# Patient Record
Sex: Male | Born: 1985 | Race: Black or African American | Hispanic: No | Marital: Single | Smoking: Current some day smoker
Health system: Southern US, Community
[De-identification: ages and names within clinical notes are randomized; demographics above are authoritative.]

---

## 2014-11-23 ENCOUNTER — Emergency Department
Admission: EM | Admit: 2014-11-23 | Discharge: 2014-11-23 | Disposition: A | Discharge status: Home / Self Care | Payer: Self-pay | Attending: Emergency Medicine | Admitting: Emergency Medicine

## 2014-11-23 ENCOUNTER — Encounter: Payer: Self-pay | Admitting: *Deleted

## 2014-11-23 DIAGNOSIS — Z720 Tobacco use: Secondary | ICD-10-CM | POA: Insufficient documentation

## 2014-11-23 DIAGNOSIS — L0291 Cutaneous abscess, unspecified: Secondary | ICD-10-CM

## 2014-11-23 DIAGNOSIS — R51 Headache: Secondary | ICD-10-CM | POA: Insufficient documentation

## 2014-11-23 DIAGNOSIS — L0231 Cutaneous abscess of buttock: Secondary | ICD-10-CM | POA: Insufficient documentation

## 2014-11-23 LAB — CBC
HCT: 44.2 % (ref 40.0–52.0)
Hemoglobin: 15 g/dL (ref 13.0–18.0)
MCH: 29.2 pg (ref 26.0–34.0)
MCHC: 33.8 g/dL (ref 32.0–36.0)
MCV: 86.4 fL (ref 80.0–100.0)
Platelets: 160 10*3/uL (ref 150–440)
RBC: 5.12 MIL/uL (ref 4.40–5.90)
RDW: 12.9 % (ref 11.5–14.5)
WBC: 10.8 10*3/uL — ABNORMAL HIGH (ref 3.8–10.6)

## 2014-11-23 LAB — URINALYSIS COMPLETE WITH MICROSCOPIC (ARMC ONLY)
BACTERIA UA: NONE SEEN
Bilirubin Urine: NEGATIVE
GLUCOSE, UA: NEGATIVE mg/dL
Hgb urine dipstick: NEGATIVE
Ketones, ur: NEGATIVE mg/dL
Leukocytes, UA: NEGATIVE
NITRITE: NEGATIVE
PH: 6 (ref 5.0–8.0)
PROTEIN: 30 mg/dL — AB
Specific Gravity, Urine: 1.029 (ref 1.005–1.030)
Squamous Epithelial / LPF: NONE SEEN

## 2014-11-23 LAB — BASIC METABOLIC PANEL
ANION GAP: 5 (ref 5–15)
BUN: 9 mg/dL (ref 6–20)
CALCIUM: 8.5 mg/dL — AB (ref 8.9–10.3)
CO2: 28 mmol/L (ref 22–32)
Chloride: 103 mmol/L (ref 101–111)
Creatinine, Ser: 1.18 mg/dL (ref 0.61–1.24)
GFR calc non Af Amer: >60 mL/min (ref >60)
Glucose, Bld: 113 mg/dL — ABNORMAL HIGH (ref 65–99)
Potassium: 3.4 mmol/L — ABNORMAL LOW (ref 3.5–5.1)
SODIUM: 136 mmol/L (ref 135–145)

## 2014-11-23 MED ORDER — MORPHINE SULFATE 4 MG/ML IJ SOLN
4.0000 mg | Freq: Once | INTRAMUSCULAR | Status: AC
  Administered 2014-11-23: 4 mg via INTRAMUSCULAR

## 2014-11-23 MED ORDER — MORPHINE SULFATE 4 MG/ML IJ SOLN
INTRAMUSCULAR | Status: AC
  Administered 2014-11-23: 4 mg via INTRAMUSCULAR
  Filled 2014-11-23: qty 1

## 2014-11-23 MED ORDER — MORPHINE SULFATE 4 MG/ML IJ SOLN
4.0000 mg | Freq: Once | INTRAMUSCULAR | Status: AC
  Administered 2014-11-23: 4 mg via INTRAVENOUS
  Filled 2014-11-23: qty 1

## 2014-11-23 MED ORDER — OXYCODONE-ACETAMINOPHEN 5-325 MG PO TABS: 1.0000 | ORAL_TABLET | Freq: Four times a day (QID) | ORAL | Status: AC | PRN

## 2014-11-23 MED ORDER — CLINDAMYCIN HCL 300 MG PO CAPS: 300.0000 mg | ORAL_CAPSULE | Freq: Three times a day (TID) | ORAL | Status: AC

## 2014-11-23 MED ORDER — LIDOCAINE HCL (PF) 1 % IJ SOLN
INTRAMUSCULAR | Status: AC
  Administered 2014-11-23: 5 mL
  Filled 2014-11-23: qty 5

## 2014-11-23 MED ORDER — CLINDAMYCIN PHOSPHATE 600 MG/50ML IV SOLN
600.0000 mg | Freq: Once | INTRAVENOUS | Status: AC
  Administered 2014-11-23: 600 mg via INTRAVENOUS
  Filled 2014-11-23: qty 50

## 2014-11-23 NOTE — ED Notes (Addendum)
Pt reports abscess on right buttock since Sunday.  Pt states he tried to pop it and it increased pain.  Pt reports increased pain with coughing, sneezing, movement.  Pt reports fever/chills.  Pt reports swelling spreads to scrotum.  Pt anxious during assessment.

## 2014-11-23 NOTE — ED Notes (Signed)
Pt has an abscess to right buttock.  Sx for 2 days.  Pt reports trying to pop it today and pain got worse.  Pt unable to sit.

## 2014-11-23 NOTE — Discharge Instructions (Signed)

## 2014-11-23 NOTE — ED Provider Notes (Signed)
[facility] Emergency Department Provider Note  ____________________________________________  Time seen: Approximately 6:08 AM  I have reviewed the triage vital signs and the nursing notes.   HISTORY  Chief Complaint Abscess    HPI Martin Alvarez is a 29 y.o. male comes in with an abscess and pain through his bottom. The patient reports that he has pain when he urinates due to his pain. He noticed a bump on Sunday and reports that he squeezed some blood and pus out of it on Monday. He reports that he tried to do it again but it simply made the symptoms of the pain worse. The patient reports that he has 10 out of 10 pain is had some mild headache and felt really feverish. The patient reports that he can't walk as he is in so much pain. He reports that anything he does seems to make the pain worse. The patient decided to come in for further evaluation because of how uncomfortable he is at this time.  History reviewed. No pertinent past medical history.  There are no active problems to display for this patient.   History reviewed. No pertinent past surgical history.  Current Outpatient Rx  Name  Route  Sig  Dispense  Refill  . clindamycin (CLEOCIN) 300 MG capsule   Oral   Take 1 capsule (300 mg total) by mouth 3 (three) times daily.   30 capsule   0   . oxyCODONE-acetaminophen (ROXICET) 5-325 MG per tablet   Oral   Take 1 tablet by mouth every 6 (six) hours as needed.   12 tablet   0     Allergies Review of patient's allergies indicates no known allergies.  History reviewed. No pertinent family history.  Social History History  Substance Use Topics  . Smoking status: Current Some Day Smoker  . Smokeless tobacco: Never Used  . Alcohol Use: Yes    Review of Systems Constitutional: Chills, feverish Eyes: No visual changes. ENT: No sore throat. Cardiovascular: Denies chest pain. Respiratory: Denies shortness of breath. Gastrointestinal: No  abdominal pain.  No nausea, no vomiting.  No diarrhea.  No constipation. Genitourinary: Negative for dysuria. Musculoskeletal: Negative for back pain. Skin: right gluteal abscess Neurological: Headache  10-point ROS otherwise negative.  ____________________________________________   PHYSICAL EXAM:  VITAL SIGNS: ED Triage Vitals  Enc Vitals Group     BP 11/23/14 0214 125/72 mmHg     Pulse Rate 11/23/14 0214 50     Resp 11/23/14 0214 18     Temp 11/23/14 0214 99.7 F (37.6 C)     Temp Source 11/23/14 0214 Oral     SpO2 11/23/14 0214 99 %     Weight 11/23/14 0214 153 lb (69.4 kg)     Height 11/23/14 0214 5\' 11"  (1.803 m)     Head Cir --      Peak Flow --      Pain Score 11/23/14 0216 10     Pain Loc --      Pain Edu? --      Excl. in GC? --     Constitutional: Alert and oriented. Well appearing and in no acute distress. Eyes: Conjunctivae are normal. PERRL. EOMI. Head: Atraumatic. Nose: No congestion/rhinnorhea. Mouth/Throat: Mucous membranes are moist.  Oropharynx non-erythematous. Cardiovascular: Normal rate, regular rhythm. Grossly normal heart sounds.  Good peripheral circulation. Respiratory: Normal respiratory effort.  No retractions. Lungs CTAB. Gastrointestinal: Soft and nontender. No distention. positive bowel sounds Genitourinary: deferred Musculoskeletal: No lower extremity  tenderness nor edema.   Neurologic:  Normal speech and language. No gross focal neurologic deficits are appreciated.  Skin:  Skin is warm, dry, fluctuant abscess in right gluteal verge, significant swelling and induration into perineum Psychiatric: Mood and affect are normal.  ____________________________________________   LABS (all labs ordered are listed, but only abnormal results are displayed)  Labs Reviewed  CBC - Abnormal; Notable for the following:    WBC 10.8 (*)    All other components within normal limits  BASIC METABOLIC PANEL - Abnormal; Notable for the following:     Potassium 3.4 (*)    Glucose, Bld 113 (*)    Calcium 8.5 (*)    All other components within normal limits  CULTURE, BLOOD (ROUTINE X 2)  CULTURE, BLOOD (ROUTINE X 2)  URINALYSIS COMPLETEWITH MICROSCOPIC ([facility] ONLY)   ____________________________________________  EKG  none ____________________________________________  RADIOLOGY  none ____________________________________________   PROCEDURES  Procedure(s) performed: please, see procedure note(s).   INCISION AND DRAINAGE Performed by: Lucrezia Europe P Consent: Verbal consent obtained. Risks and benefits: risks, benefits and alternatives were discussed Type: abscess  Body area: right gluteus  Anesthesia: local infiltration  Incision was made with a scalpel.  Local anesthetic: lidocaine 1% without epinephrine  Anesthetic total: 1.5 ml  Complexity: complex Blunt dissection to break up loculations  Drainage: purulent  Drainage amount: 5-75mls  Packing material: 1/4 in iodoform gauze  Patient tolerance: Patient tolerated the procedure well with no immediate complications.     Critical Care performed: No  ____________________________________________   INITIAL IMPRESSION / ASSESSMENT AND PLAN / ED COURSE  Pertinent labs & imaging results that were available during my care of the patient were reviewed by me and considered in my medical decision making (see chart for details).  The patient is a 29 year old male who comes in today with an abscess to his right gluteus. I did incise the patient's wound. I did give him a dose of clindamycin IV. The patient does have some significant swelling in his perineum . I will have the patient return to follow-up to determine if his swelling is improving. The patient is also awaiting a urinalysis. His urinalysis will be signed out to Dr. Fanny Bien who will follow-up the results of the urine and disposition the patient. ____________________________________________   FINAL  CLINICAL IMPRESSION(S) / ED DIAGNOSES  Final diagnoses:  Abscess  Abscess, gluteal, right      Rebecka Apley, MD 11/23/14 [phone]

## 2014-11-28 LAB — CULTURE, BLOOD (ROUTINE X 2)
CULTURE: NO GROWTH
Culture: NO GROWTH
Special Requests: NORMAL
Special Requests: NORMAL

## 2021-01-19 ENCOUNTER — Other Ambulatory Visit: Payer: Self-pay

## 2021-01-19 ENCOUNTER — Emergency Department: Payer: Worker's Compensation

## 2021-01-19 ENCOUNTER — Emergency Department
Admission: EM | Admit: 2021-01-19 | Discharge: 2021-01-19 | Disposition: A | Discharge status: Home / Self Care | Payer: Worker's Compensation | Attending: Emergency Medicine | Admitting: Emergency Medicine

## 2021-01-19 DIAGNOSIS — Y92219 Unspecified school as the place of occurrence of the external cause: Secondary | ICD-10-CM | POA: Diagnosis not present

## 2021-01-19 DIAGNOSIS — W860XXA Exposure to domestic wiring and appliances, initial encounter: Secondary | ICD-10-CM | POA: Insufficient documentation

## 2021-01-19 DIAGNOSIS — S59912A Unspecified injury of left forearm, initial encounter: Secondary | ICD-10-CM | POA: Diagnosis present

## 2021-01-19 DIAGNOSIS — Y990 Civilian activity done for income or pay: Secondary | ICD-10-CM | POA: Insufficient documentation

## 2021-01-19 DIAGNOSIS — M25532 Pain in left wrist: Secondary | ICD-10-CM | POA: Insufficient documentation

## 2021-01-19 DIAGNOSIS — F17200 Nicotine dependence, unspecified, uncomplicated: Secondary | ICD-10-CM | POA: Insufficient documentation

## 2021-01-19 DIAGNOSIS — S51832A Puncture wound without foreign body of left forearm, initial encounter: Secondary | ICD-10-CM

## 2021-01-19 MED ORDER — MELOXICAM 15 MG PO TABS: 15.0000 mg | ORAL_TABLET | Freq: Every day | ORAL | 0 refills | Status: AC

## 2021-01-19 MED ORDER — MELOXICAM 7.5 MG PO TABS: 15.0000 mg | ORAL_TABLET | Freq: Once | ORAL | Status: DC

## 2021-01-19 MED ORDER — HYDROCODONE-ACETAMINOPHEN 5-325 MG PO TABS
1.0000 | ORAL_TABLET | Freq: Once | ORAL | Status: AC
  Administered 2021-01-19: 1 via ORAL
  Filled 2021-01-19: qty 1

## 2021-01-19 MED ORDER — HYDROCODONE-ACETAMINOPHEN 5-325 MG PO TABS: 1.0000 | ORAL_TABLET | ORAL | 0 refills | Status: AC | PRN

## 2021-01-19 NOTE — ED Provider Notes (Signed)
[facility] Emergency Department Provider Note  ____________________________________________  Time seen: Approximately 8:28 PM  I have reviewed the triage vital signs and the nursing notes.   HISTORY  Chief Complaint Laceration    HPI Martin Alvarez is a 35 y.o. male who presents emergency department complaining of puncture wound to the left forearm.  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  Patient states that the wire punctured into his forearm.  He had pain running down into his fingers as well as a numb sensation.  He has full range of motion currently.  Sensation is improving at this time.  Patient's last tetanus shot was less than 2 years ago.  In addition to this injury patient has a superficial cut along the hand distal to the site.  No active bleeding.       History reviewed. No pertinent past medical history.  There are no problems to display for this patient.   History reviewed. No pertinent surgical history.  Prior to Admission medications   Medication Sig Start Date End Date Taking? Authorizing Provider  HYDROcodone-acetaminophen (NORCO/VICODIN) 5-325 MG tablet Take 1 tablet by mouth every 4 (four) hours as needed for moderate pain. 01/19/21 01/19/22 Yes Myanna Ziesmer, Delorise Royals, PA-C  meloxicam (MOBIC) 15 MG tablet Take 1 tablet (15 mg total) by mouth daily. 01/19/21  Yes Justine Cossin, Delorise Royals, PA-C  clindamycin (CLEOCIN) 300 MG capsule Take 1 capsule (300 mg total) by mouth 3 (three) times daily. 11/23/14   Rebecka Apley, MD  oxyCODONE-acetaminophen (ROXICET) 5-325 MG per tablet Take 1 tablet by mouth every 6 (six) hours as needed. 11/23/14   Rebecka Apley, MD    Allergies Patient has no known allergies.  No family history on file.  Social History Social History   Tobacco Use   Smoking status: Some Days   Smokeless tobacco: Never  Substance Use Topics   Alcohol use: Yes   Drug use: No      Review of Systems  Constitutional: No fever/chills Eyes: No visual changes. No discharge ENT: No upper respiratory complaints. Cardiovascular: no chest pain. Respiratory: no cough. No SOB. Gastrointestinal: No abdominal pain.  No nausea, no vomiting.  No diarrhea.  No constipation. Musculoskeletal: Positive for puncture wound to the left forearm. Skin: Negative for rash, abrasions, lacerations, ecchymosis. Neurological: Negative for headaches, focal weakness or numbness.  10 System ROS otherwise negative.  ____________________________________________   PHYSICAL EXAM:  VITAL SIGNS: ED Triage Vitals  Enc Vitals Group     BP 01/19/21 1755 (!) 144/108     Pulse Rate 01/19/21 1755 78     Resp 01/19/21 1755 18     Temp 01/19/21 1755 98 F (36.7 C)     Temp src --      SpO2 01/19/21 1755 100 %     Weight --      Height --      Head Circumference --      Peak Flow --      Pain Score 01/19/21 1754 10     Pain Loc --      Pain Edu? --      Excl. in GC? --      Constitutional: Alert and oriented. Well appearing and in no acute distress. Eyes: Conjunctivae are normal. PERRL. EOMI. Head: Atraumatic. ENT:      Ears:       Nose: No congestion/rhinnorhea.      Mouth/Throat: Mucous membranes are  moist.  Neck: No stridor.    Cardiovascular: Normal rate, regular rhythm. Normal S1 and S2.  Good peripheral circulation. Respiratory: Normal respiratory effort without tachypnea or retractions. Lungs CTAB. Good air entry to the bases with no decreased or absent breath sounds. Musculoskeletal: Full range of motion to all extremities. No gross deformities appreciated.  Visualization of the left forearm reveals a puncture wound along the medial aspect of the forearm and a superficial laceration over the hypothenar eminence.  This appears to be more of a scratch than a true laceration.  Puncture wound has some mild edema and mild ecchymosis in this area.  No active bleeding.  No visible  foreign body.  Area is tender to palpation.  Full sensation in all digits that there is slightly decreased sensation of the fourth and fifth digit.  Full range of motion of all digits and good grip strength.  Capillary refill less than 2 seconds all digits. Neurologic:  Normal speech and language. No gross focal neurologic deficits are appreciated.  Skin:  Skin is warm, dry and intact. No rash noted. Psychiatric: Mood and affect are normal. Speech and behavior are normal. Patient exhibits appropriate insight and judgement.   ____________________________________________   LABS (all labs ordered are listed, but only abnormal results are displayed)  Labs Reviewed - No data to display ____________________________________________  EKG   ____________________________________________  RADIOLOGY I personally viewed and evaluated these images as part of my medical decision making, as well as reviewing the written report by the radiologist.  ED Provider Interpretation: No evidence of retained foreign body or acute underlying osseous abnormality.  Soft tissue swelling identified on x-ray is underlying area of superficial laceration.  No evidence of infection.  DG Wrist Complete Left  Result Date: 01/19/2021 CLINICAL DATA:  Puncture wound to LEFT wrist from metal wire, LEFT wrist laceration. EXAM: LEFT WRIST - COMPLETE 3+ VIEW COMPARISON:  None FINDINGS: No sign of fracture or dislocation. No acute bony abnormality. Soft tissue swelling about the thenar eminence and webbing between first and second digit. Considerable swelling in this location. No radiopaque foreign body. IMPRESSION: Soft tissue swelling about the thenar eminence and webbing between first and second digit. No radiopaque foreign body or underlying osseous abnormality. Correlate with any signs of hematoma or infection in this location. Electronically Signed   By: Donzetta Kohut M.D.   On: 01/19/2021 20:07     ____________________________________________    PROCEDURES  Procedure(s) performed:    Procedures    Medications  meloxicam (MOBIC) tablet 15 mg (has no administration in time range)  HYDROcodone-acetaminophen (NORCO/VICODIN) 5-325 MG per tablet 1 tablet (has no administration in time range)     ____________________________________________   INITIAL IMPRESSION / ASSESSMENT AND PLAN / ED COURSE  Pertinent labs & imaging results that were available during my care of the patient were reviewed by me and considered in my medical decision making (see chart for details).  Review of the [place] CSRS was performed in accordance of the [facility] prior to dispensing any controlled drugs.           Patient's diagnosis is consistent with puncture wound to the forearm.  Patient presented to the emergency department after sustaining a puncture wound at work.  There was no retained foreign body upon inspection or imaging.  Patient is having continued improvement of the numbness in his fingers.  I suspect that this is from soft tissue swelling overlying the ulnar nerve.  There is no evidence of nerve injury.  Patient will be placed on anti-inflammatory limited pain medication.  He was up-to-date on tetanus immunization.  Return precautions discussed with the patient.  Otherwise follow-up with orthopedics..  Patient is given ED precautions to return to the ED for any worsening or new symptoms.     ____________________________________________  FINAL CLINICAL IMPRESSION(S) / ED DIAGNOSES  Final diagnoses:  Puncture wound of left forearm, initial encounter      NEW MEDICATIONS STARTED DURING THIS VISIT:  ED Discharge Orders          Ordered    meloxicam (MOBIC) 15 MG tablet  Daily        01/19/21 2027    HYDROcodone-acetaminophen (NORCO/VICODIN) 5-325 MG tablet  Every 4 hours PRN        01/19/21 2027                This chart was dictated using voice recognition  software/Dragon. Despite best efforts to proofread, errors can occur which can change the meaning. Any change was purely unintentional.    Lanette Hampshire 01/19/21 2033    Sharman Cheek, MD 01/19/21 2337

## 2021-01-19 NOTE — ED Triage Notes (Signed)
Pt comes with c/o left wrist lac and right thumb cut while at work. Bandage in place bleeding controlled.

## 2021-01-19 NOTE — ED Triage Notes (Signed)
[facility] profile states UDS only upon request, no request made.

## 2023-04-03 IMAGING — DX DG WRIST COMPLETE 3+V*L*
4 series · 4 of 4 positions shown · non-contrast
Comparison: None

CLINICAL DATA: Puncture wound to LEFT wrist from metal wire, LEFT
wrist laceration.

EXAM:
LEFT WRIST - COMPLETE 3+ VIEW

[wrist ap (1 of 2)]
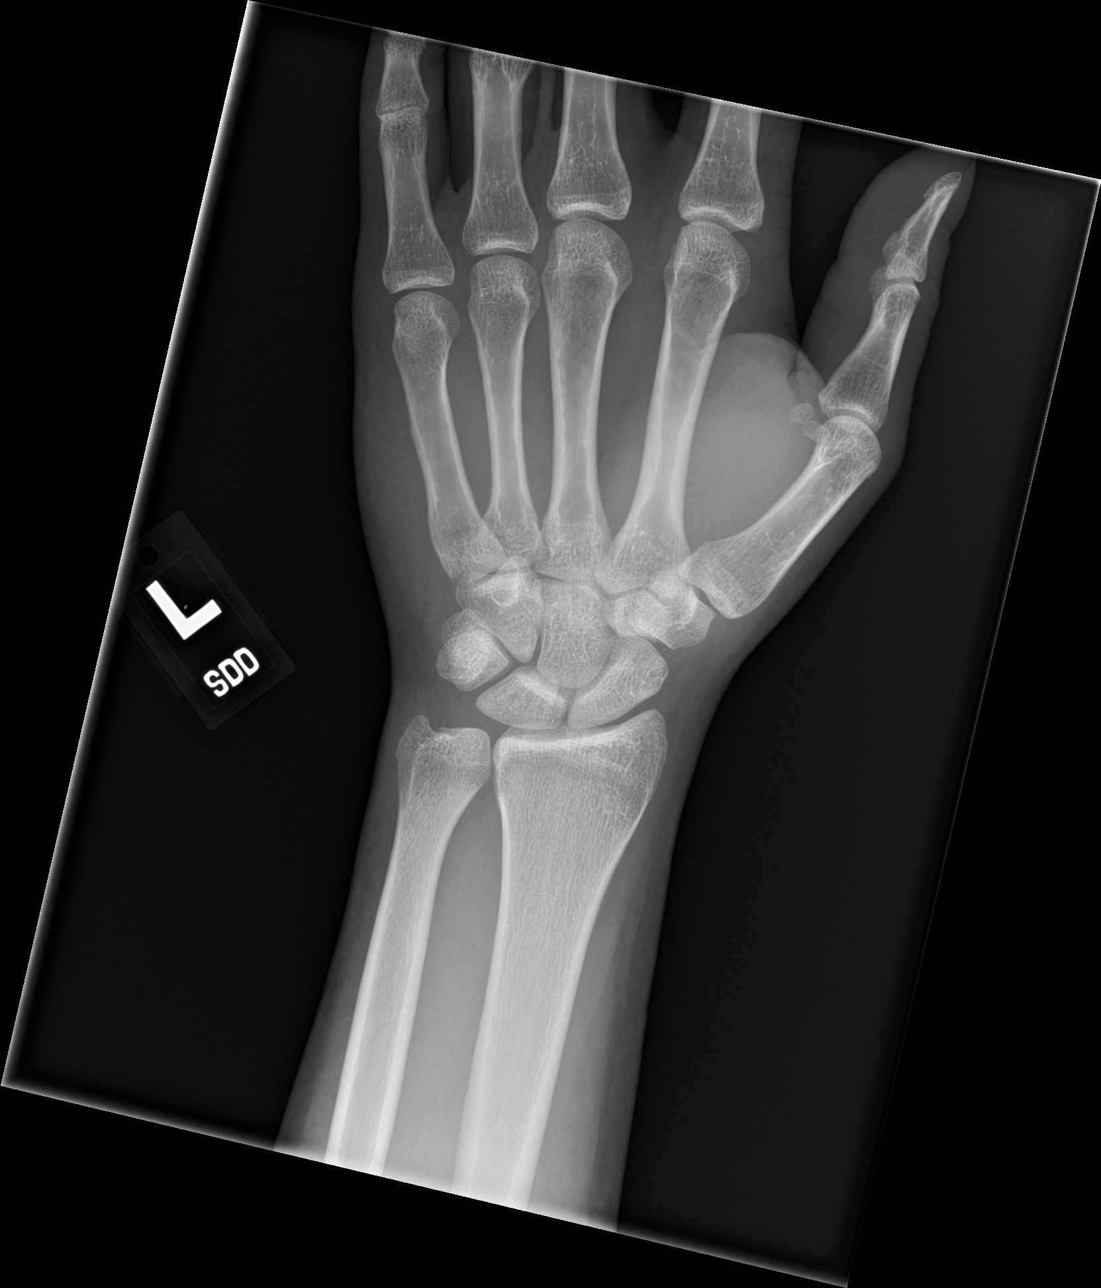

[wrist obl]
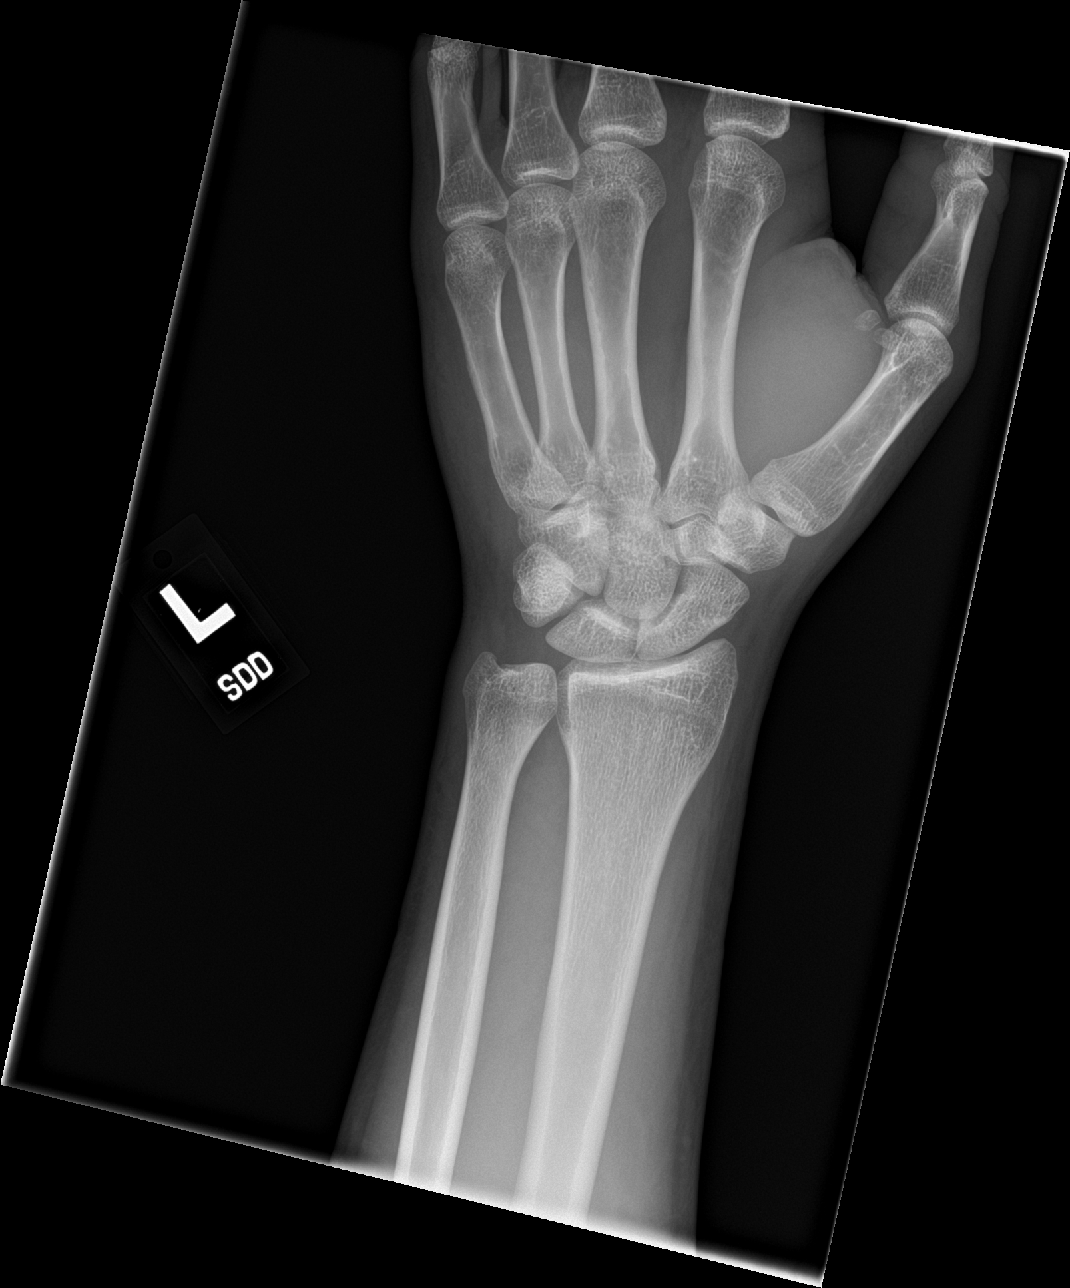

[wrist lat]
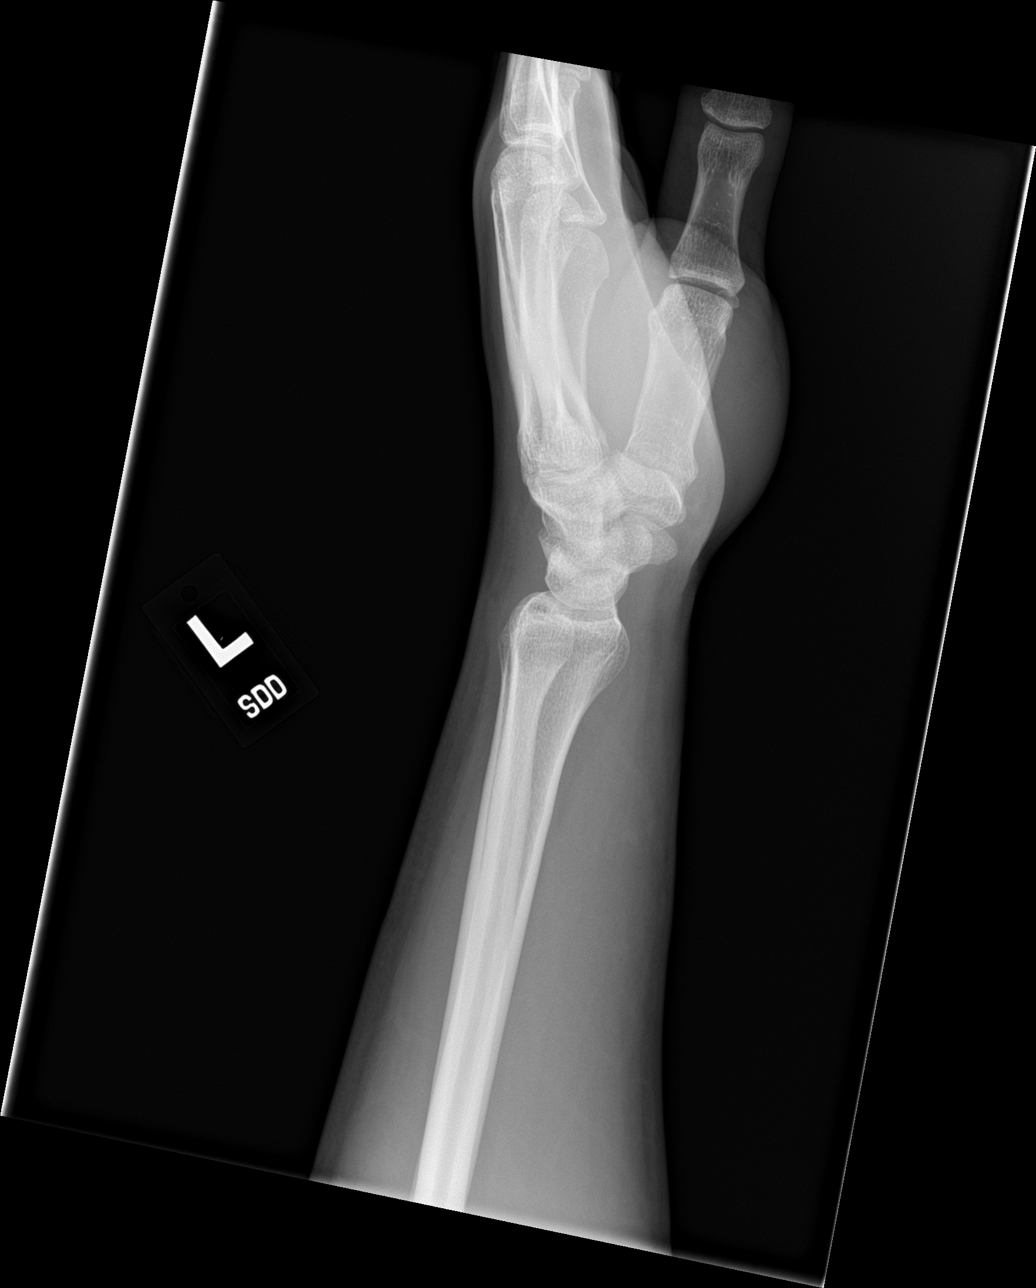

[wrist ap (2 of 2)]
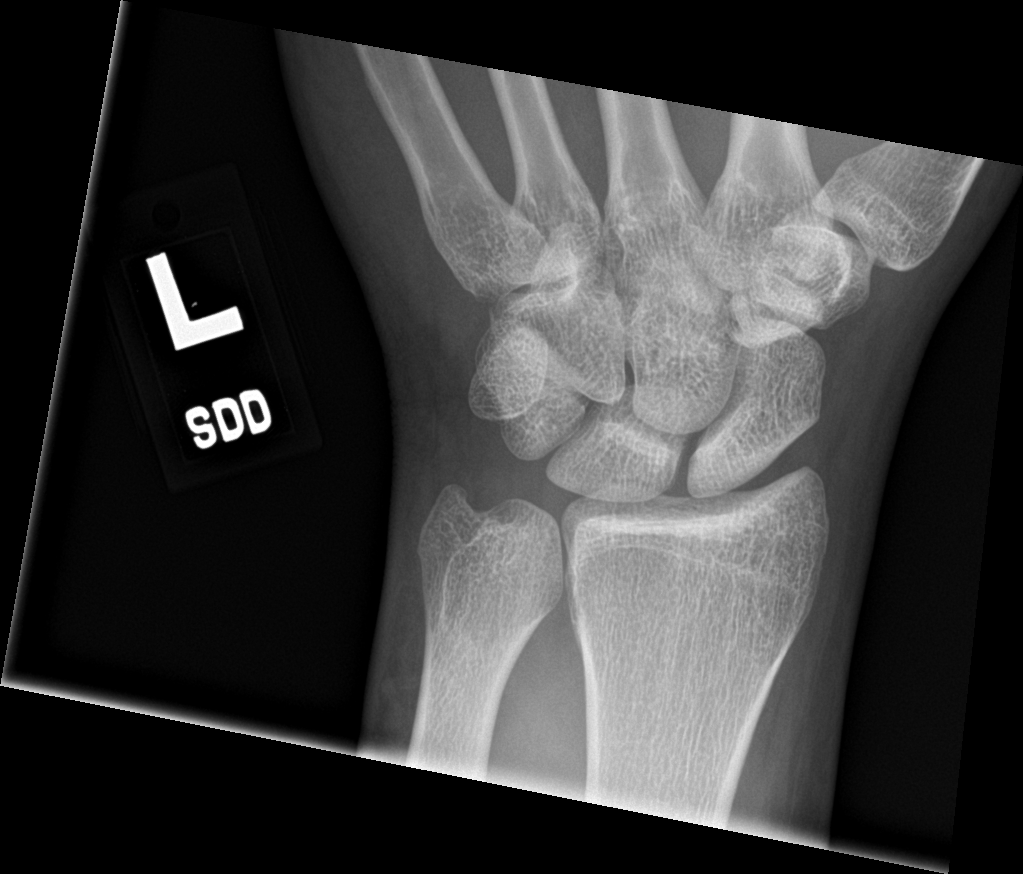

[4 of 4 positions shown; findings below may reference images not displayed]

FINDINGS: No sign of fracture or dislocation. No acute bony abnormality. Soft
tissue swelling about the thenar eminence and webbing between first
and second digit. Considerable swelling in this location. No
radiopaque foreign body.
IMPRESSION: Soft tissue swelling about the thenar eminence and webbing between
first and second digit. No radiopaque foreign body or underlying
osseous abnormality. Correlate with any signs of hematoma or
infection in this location.
